# Patient Record
Sex: Male | Born: 1995 | Race: White | Hispanic: No | Marital: Single | Smoking: Current every day smoker
Health system: Southern US, Community
[De-identification: ages and names within clinical notes are randomized; demographics above are authoritative.]

---

## 2014-09-11 ENCOUNTER — Emergency Department (INDEPENDENT_AMBULATORY_CARE_PROVIDER_SITE_OTHER)
Admission: EM | Admit: 2014-09-11 | Discharge: 2014-09-11 | Disposition: A | Discharge status: Nursing Home (Includes ICF) | Payer: 59 | Source: Home / Self Care | Attending: Family Medicine | Admitting: Family Medicine

## 2014-09-11 ENCOUNTER — Emergency Department (HOSPITAL_COMMUNITY)
Admission: EM | Admit: 2014-09-11 | Discharge: 2014-09-11 | Disposition: A | Discharge status: Home / Self Care | Payer: 59 | Attending: Emergency Medicine | Admitting: Emergency Medicine

## 2014-09-11 ENCOUNTER — Encounter (HOSPITAL_COMMUNITY): Payer: Self-pay

## 2014-09-11 ENCOUNTER — Encounter (HOSPITAL_COMMUNITY): Payer: Self-pay | Admitting: *Deleted

## 2014-09-11 DIAGNOSIS — Y9389 Activity, other specified: Secondary | ICD-10-CM | POA: Diagnosis not present

## 2014-09-11 DIAGNOSIS — R000 Tachycardia, unspecified: Secondary | ICD-10-CM | POA: Insufficient documentation

## 2014-09-11 DIAGNOSIS — M7051 Other bursitis of knee, right knee: Secondary | ICD-10-CM | POA: Insufficient documentation

## 2014-09-11 DIAGNOSIS — R509 Fever, unspecified: Secondary | ICD-10-CM

## 2014-09-11 DIAGNOSIS — L739 Follicular disorder, unspecified: Secondary | ICD-10-CM | POA: Diagnosis not present

## 2014-09-11 DIAGNOSIS — M25461 Effusion, right knee: Secondary | ICD-10-CM

## 2014-09-11 DIAGNOSIS — M25561 Pain in right knee: Secondary | ICD-10-CM | POA: Diagnosis present

## 2014-09-11 MED ORDER — CEPHALEXIN 500 MG PO CAPS: 500.0000 mg | ORAL_CAPSULE | Freq: Three times a day (TID) | ORAL | Status: AC

## 2014-09-11 MED ORDER — SULFAMETHOXAZOLE-TRIMETHOPRIM 800-160 MG PO TABS: 1.0000 | ORAL_TABLET | Freq: Two times a day (BID) | ORAL | Status: AC

## 2014-09-11 MED ORDER — MELOXICAM 7.5 MG PO TABS: 7.5000 mg | ORAL_TABLET | Freq: Every day | ORAL | Status: AC

## 2014-09-11 NOTE — ED Notes (Signed)
Pt  Has  Pain /  Swelling  Of  His  r  Knee  For  Several  Days          He  denys  specefic injury   het  Bends  A  Lot  At  Work    -  He  Also  Has  Some  Small    Scratches  To both  Lower  Legs  He  Has  Fever but  Has  No    sorethroat  Or  Any     resp  Symptoms  He  Is  Awake  And  Alert      Yet  Anxious

## 2014-09-11 NOTE — ED Provider Notes (Signed)
CSN: [id number]     Arrival date & time 09/11/14  1843 History  This chart was scribed for a non-physician practitioner, Terri Piedra, PA-C working with [name], MD by [place] Peace, ED Scribe. The patient was seen in TR07C/TR07C. The patient's care was started at 7:58 PM.    Chief Complaint  Patient presents with  . Knee Pain      Patient is a 19 y.o. male presenting with knee pain. The history is provided by the patient. No language interpreter was used.  Knee Pain Associated symptoms: no fever     HPI Comments: Ricky Mills is a 19 y.o. male who presents to the Emergency Department complaining of right knee pain onset 2 days ago that has gotten progressively worse. Swelling also noted. Pt states that he went to the [facility] prior to arrival to be evaluated but was sent here to ED after fever was detected and provider reported feeling warmth to joint. 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 He notes he tried taking Ibuprofen once since onset of pain which provided him with slight relief. Pain rated as 6/10 while ambulating, and 10/10 when kneeling down. Pt denies any recent traumas or injuries that could be responsible for current symptoms. No complaints of fever, chills, or nausea.    History reviewed. No pertinent past medical history. History reviewed. No pertinent past surgical history. History reviewed. No pertinent family history. History  Substance Use Topics  . Smoking status: Never Smoker   . Smokeless tobacco: Not on file  . Alcohol Use: No    Review of Systems  Constitutional: Negative for fever and chills.  Gastrointestinal: Negative for nausea.  Musculoskeletal: Positive for arthralgias.       Right knee pain with swelling.   All other systems reviewed and are negative.     Allergies  Review of patient's allergies indicates no known  allergies.  Home Medications   Prior to Admission medications   Medication Sig Start Date End Date Taking? Authorizing Provider  cephALEXin (KEFLEX) 500 MG capsule Take 1 capsule (500 mg total) by mouth 3 (three) times daily. 09/11/14   Joshu Furukawa A Forcucci, PA-C  meloxicam (MOBIC) 7.5 MG tablet Take 1 tablet (7.5 mg total) by mouth daily. 09/11/14   Keiaira Donlan A Forcucci, PA-C  sulfamethoxazole-trimethoprim (SEPTRA DS) 800-160 MG per tablet Take 1 tablet by mouth every 12 (twelve) hours. 09/11/14   Keylen Uzelac A Forcucci, PA-C   BP 139/92 mmHg  Pulse 143  Temp(Src) 99.4 F (37.4 C) (Oral)  Resp 16  Ht  (1.778 m)  Wt 130 lb (58.968 kg)  BMI 18.65 kg/m2  SpO2 99% Physical Exam  Constitutional: He is oriented to person, place, and time. He appears well-developed and well-nourished. No distress.  HENT:  Head: Normocephalic and atraumatic.  Eyes: Conjunctivae and EOM are normal.  Neck: Neck supple. No tracheal deviation present.  Cardiovascular: Regular rhythm, normal heart sounds and intact distal pulses.  Tachycardia present.  Exam reveals no gallop and no friction rub.   No murmur heard. Pulses:      Dorsalis pedis pulses are 2+ on the right side, and 2+ on the left side.       Posterior tibial pulses are 2+ on the right side, and 2+ on the left side.  Pulmonary/Chest: Effort normal and breath sounds normal. No respiratory distress. He has no wheezes. He  has no rales. He exhibits no tenderness.  Musculoskeletal: Normal range of motion.       Right knee: He exhibits swelling. He exhibits normal range of motion, no effusion, no ecchymosis, no deformity, no laceration, no erythema, normal alignment, no LCL laxity, normal patellar mobility, no bony tenderness, normal meniscus and no MCL laxity. No tenderness found.       Legs: Negative anterior and posterior drawer. Negative McMurray's. Full active and passive range of motion but is pain-free. Patient walks without an antalgic gait.   Neurological: He is alert and oriented to person, place, and time.  Skin: Skin is warm and dry.  Psychiatric: He has a normal mood and affect. His behavior is normal. Judgment and thought content normal.  Nursing note and vitals reviewed.   ED Course  Procedures (including critical care time) Labs Review Labs Reviewed - No data to display  Imaging Review No results found.   EKG Interpretation None     Medications - No data to display  8:06 PM- Treatment plan was discussed with patient who verbalizes understanding and agrees.   MDM   Final diagnoses:  Suprapatellar bursitis, right  Folliculitis   Patient is a 19 year old male who presents emergency room for evaluation of right knee pain. Patient sent over from urgent care. On physical examination the knee is not hot red or warm. He has full active and passive range of motion of the knee. Ligamentous exam is unremarkable. There is an area of swelling in the suprapatellar pouch. I suspect that this is likely bursitis. And has been tachycardic since he arrived but is extremely anxious. Patient was febrile at the urgent care center. He does report diarrhea yesterday and reports his girlfriend had similar symptoms. Patient does have some evidence of folliculitis in the knee. I do not feel that this is a septic knee at this time. Suspect this is more likely bursitis secondary to inflammation. Seen by and discussed with Dr. Gwendolyn GrantWalden who also agrees that this is likely bursitis. Will cover fully colitis with Bactrim and Keflex. We'll treat bursitis with Modic daily. Patient to be given referral to Dr. Roda ShuttersXu for orthopedic follow-up. Patient is stable for discharge at this time. Patient states understanding and agreement to the above workup and plan. Patient to return for intractable fevers, a hot red or warm joint, or inability to move the joint without excruciating pain. Patient and his parents state understanding and agreement at this time.  I  personally performed the services described in this documentation, which was scribed in my presence. The recorded information has been reviewed and is accurate.    Eben Burowourtney A Forcucci, PA-C 09/11/14 2050  Elwin MochaBlair Walden, MD 09/11/14 [phone]

## 2014-09-11 NOTE — ED Provider Notes (Signed)
CSN: [id number]     Arrival date & time 09/11/14  1734 History   First MD Initiated Contact with Patient 09/11/14 1822     No chief complaint on file.  (Consider location/radiation/quality/duration/timing/severity/associated sxs/prior Treatment) HPI Comments: [name] presents with right knee pain and swelling. Onset 2 days. Notes "open sores" to lower legs x 3-4 days; without drainage. No known injury. No known fevers, chills, malaise or N, V. No redness of the knee. No history of recent infections, STDs or exposures. No additional joint pain, swelling or stiffness.   The history is provided by the patient.    History reviewed. No pertinent past medical history. History reviewed. No pertinent past surgical history. History reviewed. No pertinent family history. History  Substance Use Topics  . Smoking status: Never Smoker   . Smokeless tobacco: Not on file  . Alcohol Use: No    Review of Systems  All other systems reviewed and are negative.   Allergies  Review of patient's allergies indicates no known allergies.  Home Medications   Prior to Admission medications   Not on File   BP 134/74 mmHg  Pulse 144  Temp(Src) 100.4 F (38 C) (Oral)  Resp 18  SpO2 99% Physical Exam  Constitutional: He is oriented to person, place, and time. He appears well-developed and well-nourished. No distress.  Appears anxious  Cardiovascular:  Tacycardia  Pulmonary/Chest: No respiratory distress.  Musculoskeletal:  Right knee is warm to touch, no erythema. Moderate effusion with pain upon palpation. Limited flexion secondary to effusion. Remainder of joint exam without erythema, synovitis and full ROM  Neurological: He is alert and oriented to person, place, and time.  Skin: Skin is warm. No rash noted. He is not diaphoretic. No erythema.  Bilateral lower legs with small pin point open sores, no drainage or signs of active infection  Psychiatric: His behavior is normal.  Nursing note  and vitals reviewed.   ED Course  Procedures (including critical care time) Labs Review Labs Reviewed - No data to display  Imaging Review No results found.   MDM   1. Knee effusion, right   2. Other specified fever    19 yo male with sporadic right knee effusion, fever and lower extremity sores. Suspect septic knee with a possible bacterial source from the open sores. Discussed with Dr. Konrad DoloresMerrell will send to the ER for further evaluation.     [name], PA-C 09/11/14 [id number]

## 2014-09-11 NOTE — Discharge Instructions (Signed)
Folliculitis  Folliculitis is redness, soreness, and swelling (inflammation) of the hair follicles. This condition can occur anywhere on the body. People with weakened immune systems, diabetes, or obesity have a greater risk of getting folliculitis. CAUSES  Bacterial infection. This is the most common cause.  Fungal infection.  Viral infection.  Contact with certain chemicals, especially oils and tars. Long-term folliculitis can result from bacteria that live in the nostrils. The bacteria may trigger multiple outbreaks of folliculitis over time. SYMPTOMS Folliculitis most commonly occurs on the scalp, thighs, legs, back, buttocks, and areas where hair is shaved frequently. An early sign of folliculitis is a small, white or yellow, pus-filled, itchy lesion (pustule). These lesions appear on a red, inflamed follicle. They are usually less than 0.2 inches (5 mm) wide. When there is an infection of the follicle that goes deeper, it becomes a boil or furuncle. A group of closely packed boils creates a larger lesion (carbuncle). Carbuncles tend to occur in hairy, sweaty areas of the body. DIAGNOSIS  Your caregiver can usually tell what is wrong by doing a physical exam. A sample may be taken from one of the lesions and tested in a lab. This can help determine what is causing your folliculitis. TREATMENT  Treatment may include:  Applying warm compresses to the affected areas.  Taking antibiotic medicines orally or applying them to the skin.  Draining the lesions if they contain a large amount of pus or fluid.  Laser hair removal for cases of long-lasting folliculitis. This helps to prevent regrowth of the hair. HOME CARE INSTRUCTIONS  Apply warm compresses to the affected areas as directed by your caregiver.  If antibiotics are prescribed, take them as directed. Finish them even if you start to feel better.  You may take over-the-counter medicines to relieve itching.  Do not shave  irritated skin.  Follow up with your caregiver as directed. SEEK IMMEDIATE MEDICAL CARE IF:   You have increasing redness, swelling, or pain in the affected area.  You have a fever. MAKE SURE YOU:  Understand these instructions.  Will watch your condition.  Will get help right away if you are not doing well or get worse. Document Released: 09/17/2001 Document Revised: 01/08/2012 Document Reviewed: 10/09/2011 [facility] Patient Information 2015 [place], [place]. This information is not intended to replace advice given to you by your health care provider. Make sure you discuss any questions you have with your health care provider.  Bursitis Bursitis is inflammation of a bursa. A bursa is a soft, fluid-filled sac. It cushions the soft tissue around a bone. Bursitis often occurs in the bursas near the shoulders, elbows, knees, pelvis, hips, heel, and Achilles tendon.  SYMPTOMS   Pain and tenderness in the affected area. Sometimes, pain radiates into surrounding areas. Specifically, pain with movement.  Limited range of motion of the affected joint.  Sometimes, painless swelling of the bursa.  Fever (when infected). CAUSES   Injury to a joint or bursa.  Overuse or strenuous exercise of a joint.  Gout (disease with inflamed joints).  Prolonged pressure on a joint containing bursas (resting on an elbow or kneeling).  Arthritis.  Acute or chronic infection.  Calcium deposits in shoulder tendons, with degeneration of the tendon. RISK INCREASES WITH:  Vigorous, repeated, or sudden increase in athletic training or activity level.  Failure to warm up properly.  Overstretching.  Improper exercise technique.  Playing sports on [employer]. PREVENTION  Avoid injuries or overuse of muscles.  Warm up and cool  down properly. Do this before and after physical activity.  Maintain proper conditioning:  Joint flexibility.  Muscle strength and endurance.  Cardiovascular  fitness.  Learn and use proper technique.  Wear protective equipment. PROGNOSIS  With proper treatment, symptoms often go away within 7 to 14 days.  RELATED COMPLICATIONS   Frequent recurrence of symptoms. This can result in a chronic, repetitive problem.  Joint stiffness.  Limited joint movement.  Infection of bursa.  Chronic inflammation or scarring of bursa. TREATMENT Treatment first involves protecting and resting the bursa and its joint. You may use ice or an elastic bandage to reduce inflammation. Anti-inflammatory medicines may help resolve the swelling. If symptoms persist despite treatment, a caregiver may withdraw fluid from the bursa. They might also consider a corticosteroid injection. Sometimes, bursitis will persist in spite of nonsurgical treatment or will become infected. These cases may require removal (surgical excision) of the bursa.  MEDICATION   If pain medicine is needed, nonsteroidal anti-inflammatory medicines, such as aspirin and ibuprofen, or other minor pain relievers, such as acetaminophen, are often recommended.  Do not take pain medicine for 7 days before surgery.  Prescription pain relievers are usually only prescribed after surgery. Use only as directed and only as much as you need.  Ointments applied to the skin may be helpful.  Corticosteroid injections may be given. This is done to reduce inflammation in the bursa. HEAT AND COLD:  Cold treatment (icing) relieves pain and reduces inflammation. Cold treatment should be applied for 10 to 15 minutes every 2 to 3 hours for inflammation and pain, and immediately after any activity that aggravates your symptoms. Use ice packs or an ice massage.  Heat treatment may be used prior to performing the stretching and strengthening activities prescribed by your caregiver, physical therapist, or athletic trainer. Use a heat pack or a warm soak. SEEK MEDICAL CARE IF:   Symptoms get worse or do not improve in 2  weeks, despite treatment.  New, unexplained symptoms develop. (Drugs used in treatment may produce side effects.) Document Released: 07/09/2005 Document Revised: 11/23/2013 Document Reviewed: 10/21/2008 [facility] Patient Information 2015 [place], [place]. This information is not intended to replace advice given to you by your health care provider. Make sure you discuss any questions you have with your health care provider.   Emergency Department Resource Guide 1) Find a Doctor and Pay Out of Pocket Although you won't have to find out who is covered by your insurance plan, it is a good idea to ask around and get recommendations. You will then need to call the office and see if the doctor you have chosen will accept you as a new patient and what types of options they offer for patients who are self-pay. Some doctors offer discounts or will set up payment plans for their patients who do not have insurance, but you will need to ask so you aren't surprised when you get to your appointment.  2) Contact Your Local Health Department Not all health departments have doctors that can see patients for sick visits, but many do, so it is worth a call to see if yours does. If you don't know where your local health department is, you can check in your phone book. The CDC also has a tool to help you locate your state's health department, and many state websites also have listings of all of their local health departments.  3) Find a Walk-in Clinic If your illness is not likely to be very severe or  complicated, you may want to try a walk in clinic. These are popping up all over the country in pharmacies, drugstores, and shopping centers. They're usually staffed by nurse practitioners or physician assistants that have been trained to treat common illnesses and complaints. They're usually fairly quick and inexpensive. However, if you have serious medical issues or chronic medical problems, these are probably not your best  option.  No Primary Care Doctor: - Call Health Connect at  [phone] - they can help you locate a primary care doctor that  accepts your insurance, provides certain services, etc. - Physician Referral Service- [phone]  Chronic Pain Problems: Organization         Address  Phone   Notes  [facility]  [phone] Patients need to be referred by their primary care doctor.   Medication Assistance: Organization         Address  Phone   Notes  [facility] Medication [facility] [address] [place] [place], [place] [address] [phone] --Must be a resident of [facility] -- Must have NO insurance coverage whatsoever (no Medicaid/ Medicare, etc.) -- The pt. MUST have a primary care doctor that directs their care regularly and follows them in the community   [facility]ist  [phone]   [employer]  [phone]    Agencies that provide inexpensive medical care: Organization         Address  Phone   Notes  [facility]  [phone]   [facility]    [phone]   [facility] [address] [place], [place] [address] [phone]   [facility] [address] [place], [place] [phone]   Planned Parenthood    [phone]   [facility]    [phone]   [facility]  [address], [place] Phone:  [phone], Fax:  [phone] Hours of Operation:  9 am - 6 pm, M-F.  Also accepts Medicaid/Medicare and self-pay.  [facility]  [address], [place] Phone: [phone], Fax: [phone]. Hours of Operation:  8:30 am - 5:30 pm, M-F.  Also accepts Medicaid and self-pay.  [facility] [place] [address] [place] Phone: [phone]   [facility] [address] Natasha Bence [place], [place] [phone], Ext. [phone] Mondays & Thursdays: 7-9 AM.  First 15  patients are seen on a first come, first serve basis.    Medicaid-accepting [facility] Providers:  Organization         Address  Phone   Notes  [facility] [address], [place] [phone] Also accepts self-pay patients.  [facility] [address] Laurell Josephs [place], [place]  [phone]   [facility] [address], [place] [phone]   [facility] [address], [place] [phone]   Renaye Rakers [address], [place]   [phone] Only accepts [place] Access [place] patients after they have their name applied to their card.   Self-Pay (no insurance) in [facility]:  Organization         Address  Phone   Notes  Sickle Cell Patients, [facility] Internal Medicine [address] [place], [place] [phone]   [facility] Urgent Care [address]  [address], [place] [phone]) [phone]   [facility] [place]  [address], [place] [phone]) [phone]   [facility]/Dr. Osei-Bonsu  [address] [place] [address], [place] or [phone], [place] [phone] Phone number for both [place] and [place] locations is the same.  [facility] [address], [place] [phone]) [phone]   [facility] [place] [address] [place] [address], [place] or [address] [phone]) [phone]   [facility] [address], [place] [phone]) [phone], phone; [phone](336) [phone], fax Sees patients 1st and 3rd Saturday of every month.  Must not qualify for public or private insurance (i.e. Medicaid, Medicare, [place] Health Choice, Veterans' Benefits)  Household income should be no more than 200% of the poverty level The clinic cannot treat you if you are pregnant or think you are pregnant  Sexually transmitted diseases are not treated at the clinic.    Dental  Care: Organization         Address  Phone  Notes  [facility] [address] [facility] [address], [place] [phone]) [phone] Accepts children up to age 21 who are enrolled in [place] or [place] Health Choice; pregnant women with a Medicaid card; and children who have applied for Medicaid or [place] Health Choice, but were declined, whose parents can pay a reduced fee at time of service.  [facility] [place]  [address], [place] [phone] Accepts children up to age 221 who are enrolled in [place] or [place] Health Choice; pregnant women with a Medicaid card; and children who have applied for Medicaid or [place] Health Choice, but were declined, whose parents can pay a reduced fee at time of service.  [facility]  [address], [place] [phone]) [phone] Patients are seen by appointment only. Walk-ins are not accepted. [facility] will see patients 21 years of age and older. Monday - Tuesday (8am-5pm) Most Wednesdays (8:30-5pm) $30 per visit, cash only  [facility]  [address], [facility] [phone] Patients are seen by appointment only. Walk-ins are not accepted. [facility] will see patients 21 years of age and older. One Wednesday Evening (Monthly: Volunteer Based).  $30 per visit, cash only  [employer] [facility]  [phone] for adults; Children under age 154, call Graduate Pediatric Dentistry at [phone]. Children aged 594-14, please call [phone] to request a pediatric application.  Dental services are provided in all areas of dental care including fillings, crowns and bridges, complete and partial dentures, implants, gum treatment, root canals, and extractions. Preventive care is also provided. Treatment is provided to both adults and children. Patients are selected via a lottery and there is often a waiting list.   [facility] [address], [place]  [phone] [email or web address]   [facility] [address], [place], [place] [phone], Ext. [phone] Second and Fourth Thursday of each month, opens at 6:30 AM; Clinic ends at 9 AM.  Patients are seen on a first-come first-served basis, and a limited number are seen during each clinic.   [facility]  [address], [place], [place] [phone]) [phone]   Eligibility Requirements You must have lived in [place], [place], or [place] counties for at least the last three months.   You cannot be  eligible for state or federal sponsored [place], including [employer], [place], or [employer].   You generally cannot be eligible for healthcare insurance through your employer.    How to apply: Eligibility screenings are held every Tuesday and Wednesday afternoon from 1:00 pm until 4:00 pm. You do not need an appointment for the interview!  [facility] [address] [place], [place] [phone]   [facility] Health Department  [phone]   [facility] Health Department  [phone]   [facility]  [phone]    Behavioral Health Resources in the Community: Intensive Outpatient Programs Organization         Address  Phone  Notes  [facility] [address], [place], [place] [phone]   [facility] Outpatient [address], [place], [place] [phone]   [facility] [address] [place], [place]  [phone]   [facility] Mental Health [address],  [place], [place] [phone] or [phone]   Substance Abuse Resources Organization         Address  Phone  Notes  [facility]  [phone]   [facility]  [phone]   [facility]  7542193651   Floydene Flock  [phone]   Residential & Outpatient Substance Abuse Program  [phone]    Psychological Services Organization         Address  Phone  Notes  [facility] Behavioral Health  [phone]   [facility] Services  [phone]   [facility] Mental [facility] [address], [place] [phone] or [phone]    Mobile Crisis Teams Organization         Address  Phone  Notes  [facility], Mobile Crisis Care Unit  [phone]   [facility]  [address] [place], [place] [phone]   Doristine Locks [address] [place] [phone]    Self-Help/Support Groups Organization         Address  Phone             Notes  [facility] [place] - variety of support groups  336- I7437963 Call for more information  Narcotics Anonymous (NA), [facility] [address], [employer] [place]  2 meetings at this location   Statistician         Address  Phone  Notes  [facility] [address],    [place]  [phone]   [facility]  [address] [place] [address], [place], [place] [phone]   [facility] Treatment Facility [address] [place], [place] [phone] Admissions: 8am-3pm M-F  [facility] [address]    [place], [place] [phone]   [facility] [address] [place], [place], [place] [phone]   [facility] [address]  Liborio Negrin [place], [place] [phone]   [facility] - Intensive Outpatient [address] Laurell Josephs [address], [place], [place] [phone]   [facility] [address] [place]  [place], [place] [phone] or [phone]   [facility] ([facility]) [address] [place], [place] [phone] Accepts Medicaid  [facility] [address]  [place] [phone] Substance Abuse/Addiction Treatment   [facility] Organization         Address  Phone  Notes  [facility]  531-472-8061   Angie Fava, PhD [phone]  [place], [place]   [phone]) [phone] or [phone](336) [phone]   [facility]   [address] [place], [place] [phone]) [phone]   [facility] [address], [place], [place] [phone]) [phone] Insurance/Medicaid/sponsorship through [facility]  [facility] [address]                                    [place], [place] [phone]) [phone] Therapy/tele-psych/case  [facility] [address]   [place], [place] [phone]) [phone]    Dr. Lolly MustacheArfeen  [phone]) [phone]   [facility] [place]  [employer] [facility] 1) [address],  2) [address], [place] 3)  [address], [place] [phone](336) [phone]) [phone]  [phone]) [phone]   [facility] Child Abuse Hotline [phone](336) [phone] or [phone](336) [phone] (After Hours)

## 2014-09-11 NOTE — ED Notes (Signed)
Pt seen at urgent care for fever, right pain and swelling.  Pt has few pinpoint open sores on bilateral lower extremities with no drainage.  No known injuries.

## 2014-09-14 ENCOUNTER — Encounter (HOSPITAL_BASED_OUTPATIENT_CLINIC_OR_DEPARTMENT_OTHER): Payer: Self-pay | Admitting: Emergency Medicine

## 2014-10-08 ENCOUNTER — Other Ambulatory Visit: Payer: Self-pay | Admitting: Orthopaedic Surgery

## 2014-10-08 DIAGNOSIS — M25561 Pain in right knee: Secondary | ICD-10-CM

## 2014-10-20 ENCOUNTER — Ambulatory Visit
Admission: RE | Admit: 2014-10-20 | Discharge: 2014-10-20 | Disposition: A | Discharge status: Home / Self Care | Payer: 59 | Source: Ambulatory Visit | Attending: Orthopaedic Surgery | Admitting: Orthopaedic Surgery

## 2014-10-20 DIAGNOSIS — M25561 Pain in right knee: Secondary | ICD-10-CM

## 2014-12-03 ENCOUNTER — Other Ambulatory Visit (HOSPITAL_COMMUNITY): Payer: Self-pay | Admitting: Rheumatology

## 2014-12-03 ENCOUNTER — Ambulatory Visit (HOSPITAL_COMMUNITY)
Admission: RE | Admit: 2014-12-03 | Discharge: 2014-12-03 | Disposition: A | Discharge status: Home / Self Care | Payer: 59 | Source: Ambulatory Visit | Attending: Rheumatology | Admitting: Rheumatology

## 2014-12-03 DIAGNOSIS — J8410 Pulmonary fibrosis, unspecified: Secondary | ICD-10-CM | POA: Diagnosis not present

## 2014-12-03 DIAGNOSIS — Z111 Encounter for screening for respiratory tuberculosis: Secondary | ICD-10-CM | POA: Insufficient documentation

## 2014-12-03 DIAGNOSIS — A159 Respiratory tuberculosis unspecified: Secondary | ICD-10-CM

## 2014-12-07 ENCOUNTER — Other Ambulatory Visit (HOSPITAL_COMMUNITY): Payer: Self-pay | Admitting: Rheumatology

## 2014-12-07 DIAGNOSIS — R918 Other nonspecific abnormal finding of lung field: Secondary | ICD-10-CM

## 2014-12-13 ENCOUNTER — Ambulatory Visit (HOSPITAL_COMMUNITY): Admission: RE | Admit: 2014-12-13 | Payer: 59 | Source: Ambulatory Visit

## 2015-01-28 ENCOUNTER — Emergency Department (HOSPITAL_COMMUNITY)
Admission: EM | Admit: 2015-01-28 | Discharge: 2015-01-28 | Disposition: A | Discharge status: Home / Self Care | Payer: No Typology Code available for payment source | Attending: Emergency Medicine | Admitting: Emergency Medicine

## 2015-01-28 ENCOUNTER — Encounter (HOSPITAL_COMMUNITY): Payer: Self-pay | Admitting: Emergency Medicine

## 2015-01-28 DIAGNOSIS — Y998 Other external cause status: Secondary | ICD-10-CM | POA: Insufficient documentation

## 2015-01-28 DIAGNOSIS — Z720 Tobacco use: Secondary | ICD-10-CM | POA: Diagnosis not present

## 2015-01-28 DIAGNOSIS — Y9389 Activity, other specified: Secondary | ICD-10-CM | POA: Insufficient documentation

## 2015-01-28 DIAGNOSIS — Z791 Long term (current) use of non-steroidal anti-inflammatories (NSAID): Secondary | ICD-10-CM | POA: Insufficient documentation

## 2015-01-28 DIAGNOSIS — Y92410 Unspecified street and highway as the place of occurrence of the external cause: Secondary | ICD-10-CM | POA: Insufficient documentation

## 2015-01-28 DIAGNOSIS — S40812A Abrasion of left upper arm, initial encounter: Secondary | ICD-10-CM | POA: Insufficient documentation

## 2015-01-28 DIAGNOSIS — S4992XA Unspecified injury of left shoulder and upper arm, initial encounter: Secondary | ICD-10-CM | POA: Diagnosis not present

## 2015-01-28 DIAGNOSIS — M79602 Pain in left arm: Secondary | ICD-10-CM

## 2015-01-28 DIAGNOSIS — Z79899 Other long term (current) drug therapy: Secondary | ICD-10-CM | POA: Diagnosis not present

## 2015-01-28 MED ORDER — CYCLOBENZAPRINE HCL 10 MG PO TABS: 10.0000 mg | ORAL_TABLET | Freq: Two times a day (BID) | ORAL | Status: AC | PRN
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
## 2015-01-28 NOTE — Discharge Instructions (Signed)
Please monitor for new or worsening symptoms. He developed any concerning signs or symptoms please return to the emergency room for further evaluation and management. Please follow-up with your primary care provider in one week for reevaluation if symptoms persist. Please use Flexeril only as needed, ibuprofen or Tylenol as needed for pain. Monitor for infection.

## 2016-03-19 ENCOUNTER — Emergency Department (HOSPITAL_COMMUNITY)
Admission: EM | Admit: 2016-03-19 | Discharge: 2016-03-19 | Disposition: A | Discharge status: Home / Self Care | Payer: 59 | Attending: Dermatology | Admitting: Dermatology

## 2016-03-19 ENCOUNTER — Encounter (HOSPITAL_COMMUNITY): Payer: Self-pay

## 2016-03-19 DIAGNOSIS — Z5321 Procedure and treatment not carried out due to patient leaving prior to being seen by health care provider: Secondary | ICD-10-CM | POA: Diagnosis not present

## 2016-03-19 DIAGNOSIS — R5383 Other fatigue: Secondary | ICD-10-CM | POA: Diagnosis present

## 2016-03-19 DIAGNOSIS — F17210 Nicotine dependence, cigarettes, uncomplicated: Secondary | ICD-10-CM | POA: Diagnosis not present

## 2016-03-19 LAB — CBC
HEMATOCRIT: 44.7 % (ref 39.0–52.0)
Hemoglobin: 15.3 g/dL (ref 13.0–17.0)
MCH: 31.4 pg (ref 26.0–34.0)
MCHC: 34.2 g/dL (ref 30.0–36.0)
MCV: 91.6 fL (ref 78.0–100.0)
Platelets: 316 10*3/uL (ref 150–400)
RBC: 4.88 MIL/uL (ref 4.22–5.81)
RDW: 12.1 % (ref 11.5–15.5)
WBC: 5.8 10*3/uL (ref 4.0–10.5)

## 2016-03-19 LAB — BASIC METABOLIC PANEL
ANION GAP: 14 (ref 5–15)
BUN: 8 mg/dL (ref 6–20)
CALCIUM: 9.9 mg/dL (ref 8.9–10.3)
CO2: 23 mmol/L (ref 22–32)
Chloride: 100 mmol/L — ABNORMAL LOW (ref 101–111)
Creatinine, Ser: 0.87 mg/dL (ref 0.61–1.24)
GFR calc non Af Amer: >60 mL/min (ref >60)
GLUCOSE: 185 mg/dL — AB (ref 65–99)
POTASSIUM: 3.1 mmol/L — AB (ref 3.5–5.1)
Sodium: 137 mmol/L (ref 135–145)

## 2016-03-19 LAB — CBG MONITORING, ED: Glucose-Capillary: 173 mg/dL — ABNORMAL HIGH (ref 65–99)

## 2016-03-19 NOTE — ED Notes (Signed)
Pt's CBG result was 173. Informed Gabe - RN.

## 2016-03-19 NOTE — ED Triage Notes (Signed)
Pt states felt lightheaded/dizzy this afternoon. Denies any LOC, chest pain or N/V/D. Pt states hx of migraines. Pt states similar feeling. No complaints at this time.

## 2016-03-19 NOTE — ED Notes (Signed)
Pt states he feels 100% better and thinks he had mild panic attack. Pt refusing any further care or assessment

## 2017-03-10 IMAGING — MR MR KNEE*R* W/O CM
4 of 6 series · 19 of 40 positions shown · non-contrast
Comparison: None.

CLINICAL DATA: Two month history of right knee pain with swelling
status post arthrocentesis x2. No acute injury. Initial encounter.

EXAM:
MRI OF THE RIGHT KNEE WITHOUT CONTRAST
TECHNIQUE: Multiplanar, multisequence MR imaging of the knee was performed. No
intravenous contrast was administered.

[Series 3: PD fat-sat · axial · 4.0mm · 0.31mm/px · z∈[-24,+87]mm · 8 of 25 slices shown (1 of 4)]
[im 1/25]
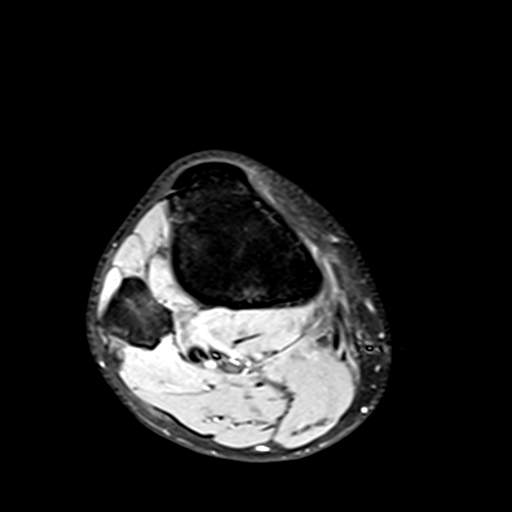
[im 4/25]
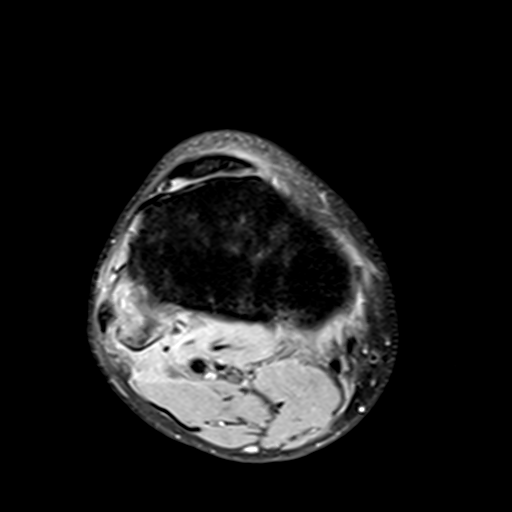
[im 7/25]
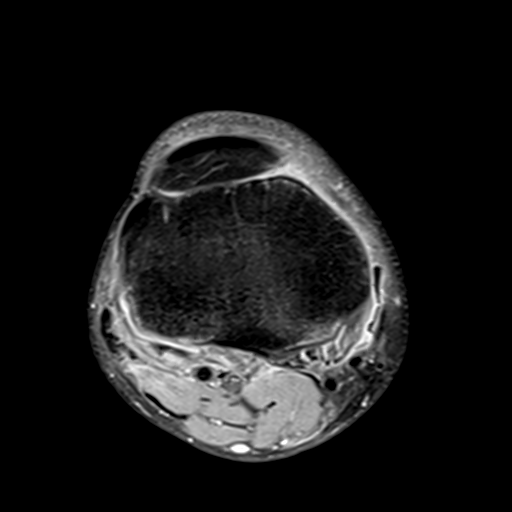
[im 11/25]
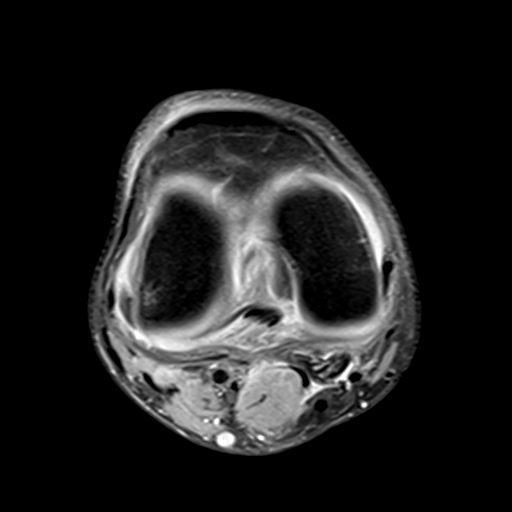
[im 14/25]
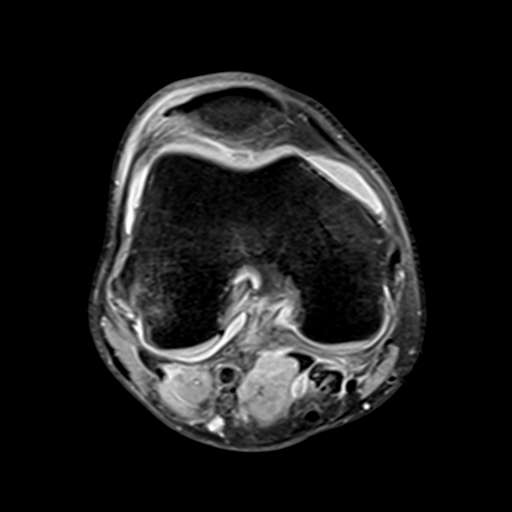
[im 18/25]
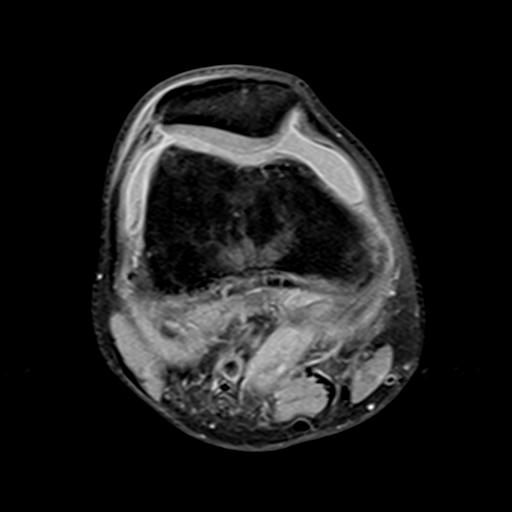
[im 21/25]
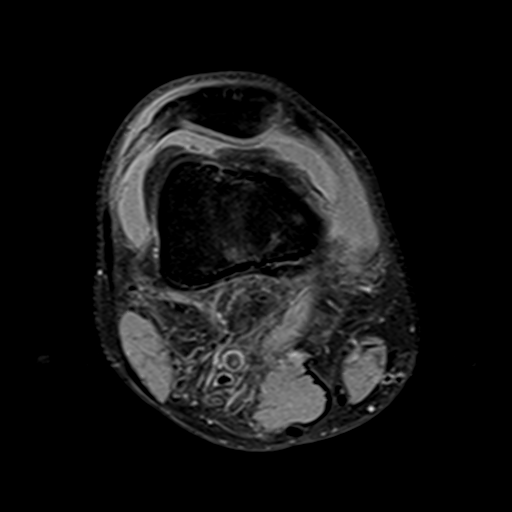
[im 25/25]
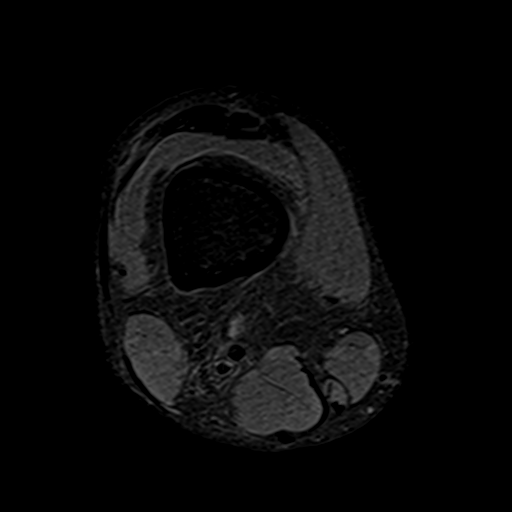

[Series 6: PD fat-sat · sagittal · 3.2mm · 0.31mm/px · 5 of 25 slices shown (2 of 4)]
[im 1/25]
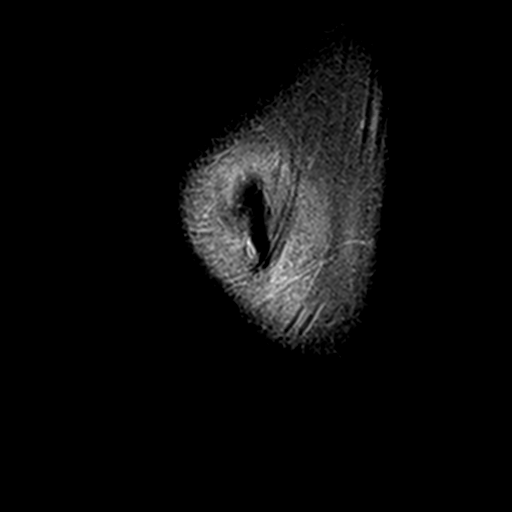
[im 5/25]
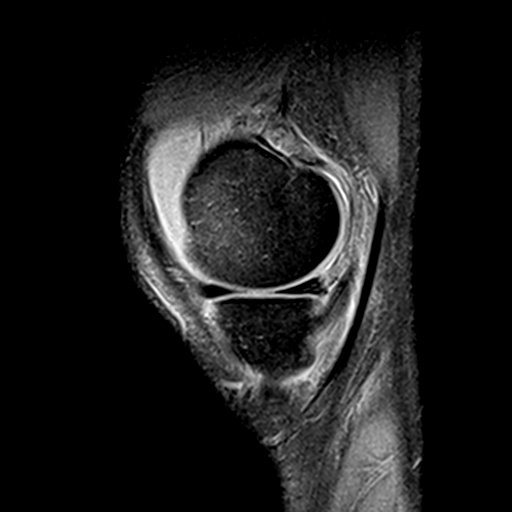
[im 9/25]
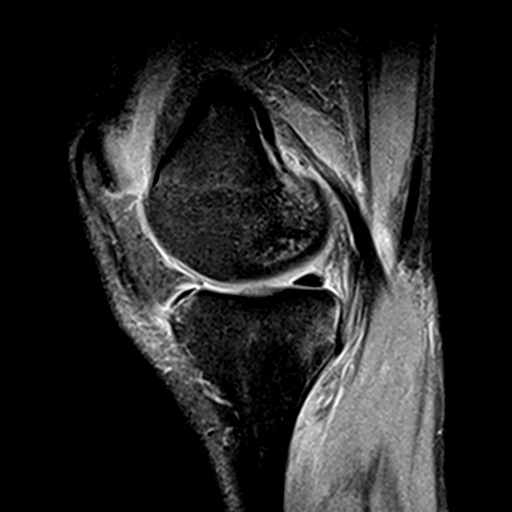
[im 13/25]
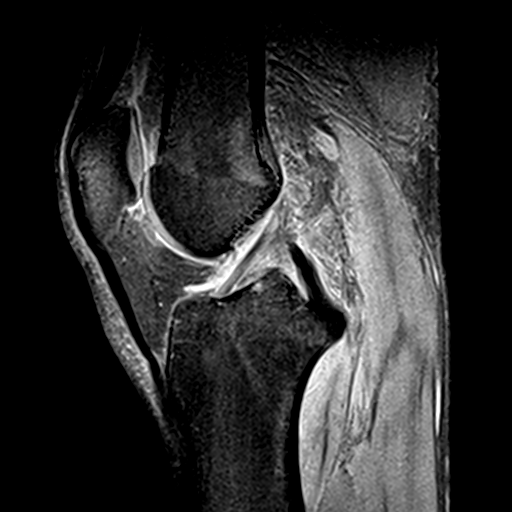
[im 21/25]
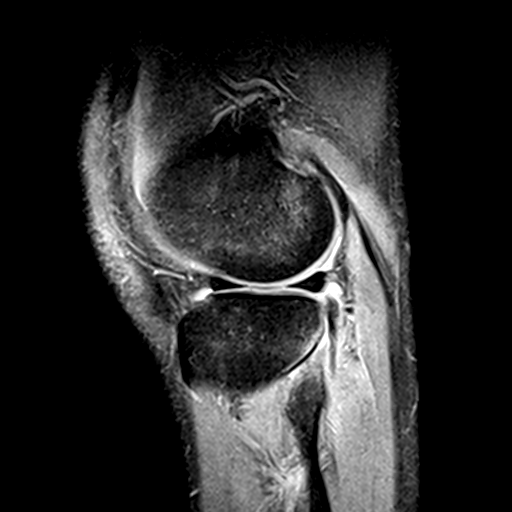

[Series 7: PD fat-sat · coronal · 3.5mm · 0.31mm/px · 3 of 23 slices shown (3 of 4)]
[im 4/23]
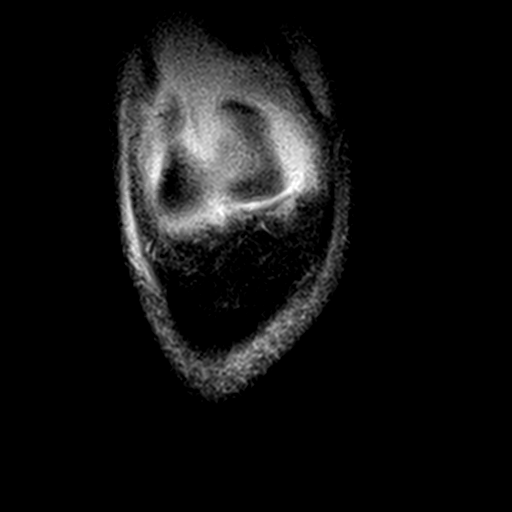
[im 12/23]
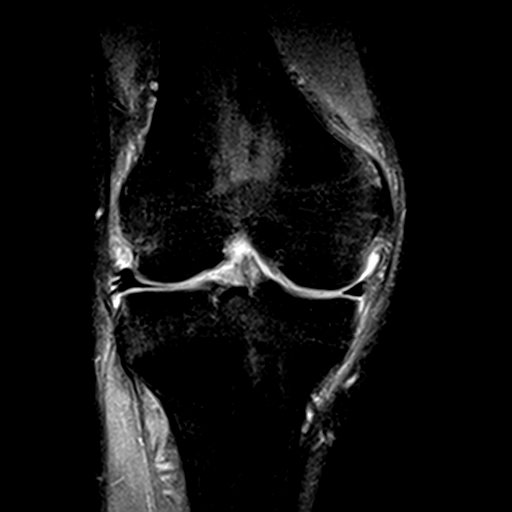
[im 19/23]
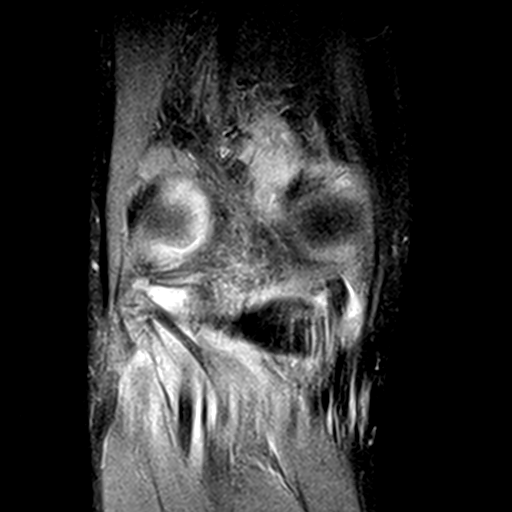

[Series 8: PD fat-sat · oblique · 2.0mm · 0.29mm/px · 3 of 15 slices shown (4 of 4)]
[im 1/15]
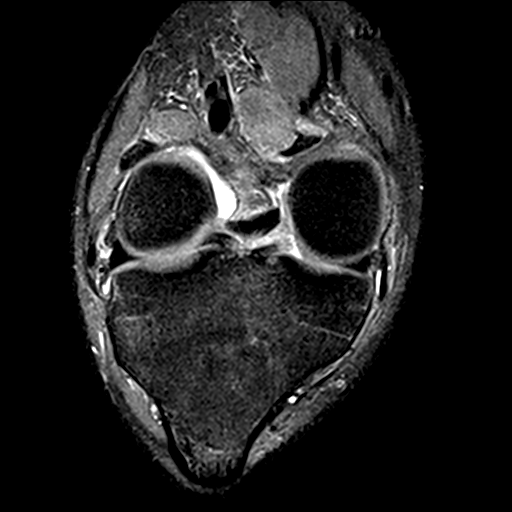
[im 10/15]
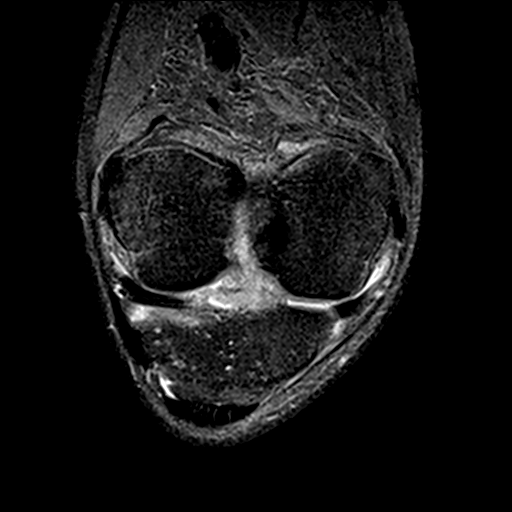
[im 15/15]
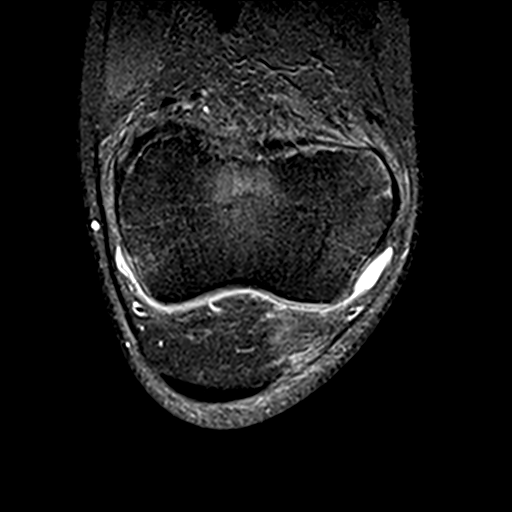

[19 of 40 positions shown; findings below may reference images not displayed]

FINDINGS: MENISCI

Medial meniscus: The meniscal body appears somewhat diminutive on
the coronal images, although no meniscal tear or displaced meniscal
fragment identified. The meniscal root is intact.

Lateral meniscus:  Intact with normal morphology.

LIGAMENTS

Cruciates:  Intact.

Collaterals: Intact. There is a small amount fluid within the pes
anserine bursa.

CARTILAGE

Patellofemoral:  Preserved.

Medial:  Preserved.

Lateral:  Preserved.

Joint: Small to moderate knee joint effusion appears minimally
complex. No loose bodies observed.

Popliteal Fossa: Small Baker's cyst. There is prominent fluid within
the popliteus hiatus. There is insertional semimembranosus
tendinosis.

Extensor Mechanism:  Intact.

Bones: There is patchy bone marrow edema within the fibular head,
peripherally in the lateral femoral condyle and posteriorly in the
medial tibial plateau. No cortical fracture demonstrated.
IMPRESSION: 1. Suspected synovitis with mildly complex joint effusion, fluid in
the popliteus hiatus and patchy bone marrow edema as described.
2. No chondral defect or erosive changes identified.
3. Intact menisci, cruciate and collateral ligaments.
